# Patient Record
Sex: Female | Born: 1970 | Race: White | Hispanic: No | Marital: Married | Smoking: Never smoker
Health system: Southern US, Community
[De-identification: ages and names within clinical notes are randomized; demographics above are authoritative.]

## PROBLEM LIST (undated history)

## (undated) HISTORY — PX: PARTIAL HYSTERECTOMY: SHX80

---

## 1996-07-17 ENCOUNTER — Encounter: Payer: Self-pay | Admitting: Internal Medicine

## 1996-08-09 ENCOUNTER — Encounter: Payer: Self-pay | Admitting: Internal Medicine

## 1996-08-10 ENCOUNTER — Encounter: Payer: Self-pay | Admitting: Internal Medicine

## 2000-03-18 ENCOUNTER — Inpatient Hospital Stay (HOSPITAL_COMMUNITY): Admission: AD | Admit: 2000-03-18 | Discharge: 2000-03-18 | Payer: Self-pay | Admitting: Obstetrics and Gynecology

## 2000-03-24 ENCOUNTER — Inpatient Hospital Stay (HOSPITAL_COMMUNITY): Admission: AD | Admit: 2000-03-24 | Discharge: 2000-03-24 | Payer: Self-pay | Admitting: Obstetrics & Gynecology

## 2000-07-06 ENCOUNTER — Inpatient Hospital Stay (HOSPITAL_COMMUNITY): Admission: AD | Admit: 2000-07-06 | Discharge: 2000-07-06 | Payer: Self-pay | Admitting: Obstetrics and Gynecology

## 2000-07-16 ENCOUNTER — Inpatient Hospital Stay (HOSPITAL_COMMUNITY): Admission: AD | Admit: 2000-07-16 | Discharge: 2000-07-19 | Payer: Self-pay | Admitting: Obstetrics and Gynecology

## 2002-07-11 ENCOUNTER — Encounter: Admission: RE | Admit: 2002-07-11 | Discharge: 2002-07-11 | Payer: Self-pay | Admitting: Obstetrics and Gynecology

## 2002-07-11 ENCOUNTER — Encounter: Payer: Self-pay | Admitting: Obstetrics and Gynecology

## 2003-01-16 ENCOUNTER — Encounter: Payer: Self-pay | Admitting: Obstetrics and Gynecology

## 2003-01-16 ENCOUNTER — Encounter: Admission: RE | Admit: 2003-01-16 | Discharge: 2003-01-16 | Payer: Self-pay | Admitting: Obstetrics and Gynecology

## 2003-10-17 ENCOUNTER — Other Ambulatory Visit: Admission: RE | Admit: 2003-10-17 | Discharge: 2003-10-17 | Payer: Self-pay | Admitting: Obstetrics and Gynecology

## 2004-09-16 ENCOUNTER — Other Ambulatory Visit: Admission: RE | Admit: 2004-09-16 | Discharge: 2004-09-16 | Payer: Self-pay | Admitting: Obstetrics and Gynecology

## 2005-10-28 ENCOUNTER — Other Ambulatory Visit: Admission: RE | Admit: 2005-10-28 | Discharge: 2005-10-28 | Payer: Self-pay | Admitting: Obstetrics and Gynecology

## 2006-01-28 ENCOUNTER — Ambulatory Visit (HOSPITAL_COMMUNITY): Admission: RE | Admit: 2006-01-28 | Discharge: 2006-01-28 | Payer: Self-pay | Admitting: Obstetrics and Gynecology

## 2006-01-28 ENCOUNTER — Encounter (INDEPENDENT_AMBULATORY_CARE_PROVIDER_SITE_OTHER): Payer: Self-pay | Admitting: *Deleted

## 2008-04-17 ENCOUNTER — Ambulatory Visit: Payer: Self-pay | Admitting: Internal Medicine

## 2008-04-17 DIAGNOSIS — R1032 Left lower quadrant pain: Secondary | ICD-10-CM | POA: Insufficient documentation

## 2008-04-17 DIAGNOSIS — K5900 Constipation, unspecified: Secondary | ICD-10-CM | POA: Insufficient documentation

## 2008-04-18 ENCOUNTER — Encounter: Payer: Self-pay | Admitting: Internal Medicine

## 2008-04-19 ENCOUNTER — Encounter: Payer: Self-pay | Admitting: Internal Medicine

## 2008-05-27 ENCOUNTER — Ambulatory Visit: Payer: Self-pay | Admitting: Internal Medicine

## 2010-10-30 ENCOUNTER — Encounter
Admission: RE | Admit: 2010-10-30 | Discharge: 2010-10-30 | Payer: Self-pay | Source: Home / Self Care | Attending: Obstetrics and Gynecology | Admitting: Obstetrics and Gynecology

## 2010-12-06 ENCOUNTER — Encounter: Payer: Self-pay | Admitting: Internal Medicine

## 2010-12-06 ENCOUNTER — Encounter: Payer: Self-pay | Admitting: Obstetrics and Gynecology

## 2011-04-02 NOTE — Discharge Summary (Signed)
[facility] [place]  Patient:    Monica Carpenter, Monica Carpenter                      MRN: [id number] Adm. Date:  [id number] Disc. Date: [id number] Attending:  Cordelia Pen Ii Dictator:   Danie Chandler, R.N.                           Discharge Summary  ADMISSION DIAGNOSES:          1. Intrauterine pregnancy at term.                               2. Previous cesarean section x 3.  DISCHARGE DIAGNOSES:          1. Intrauterine pregnancy at term.                               2. Previous cesarean section x 3.  PROCEDURES:                   On July 16, 2000, repeat low transverse cesarean section.  HISTORY OF PRESENT ILLNESS:   The patient is 40 year old, married, white female, gravida 4, para 3, with an estimated date of confinement of July 25, 2000, who has had three previous cesarean sections.  HOSPITAL COURSE:              The patient was taken to the operating room and underwent the above-named procedure without complications.  This was productive of a viable female infant with Apgars of 8 at one minute and 9 at five minutes and an arterial cord pH of 7.34.  Postoperatively, the patient did well.  On postoperative day #1, the patients hemoglobin was 10.6, hematocrit 30.3, and white blood cell count 9.0.  The patient had a good return of bowel function on this day.  On postoperative day #2, the patient was ambulating well without difficulty and had good pain control.  She was also tolerating a regular diet.  DISPOSITION:                  She was discharged home on postoperative day #3.  CONDITION ON DISCHARGE:       Good.  DIET:                         Regular as tolerated.  ACTIVITY:                     No heavy lifting.  No driving.  No vaginal entry.  FOLLOW-UP:                    She is to follow up in the office on Friday for staple removal.  She is to call for temperature for greater than 100 degrees, persistent nausea or vomiting, heavy  vaginal bleeding, and/or redness or drainage from the incision site.  DISCHARGE MEDICATIONS:        1. Prenatal vitamins one p.o. q.d.                               2. Tylox, #30, one to two p.o. q.3-4h. p.r.n.  pain. DD:  07/19/00 TD:  07/19/00 Job: [id number]

## 2011-04-02 NOTE — Op Note (Signed)
[facility] [address]  Patient:    Monica Carpenter, Monica Carpenter                      MRN: [id number] Proc. Date: 07/16/00 Adm. Date:  [id number] Attending:  Cordelia Pen Ii                           Operative Report  PREOPERATIVE DIAGNOSES:       1. Intrauterine pregnancy at term.                               2. Previous cesarean section x 3.  POSTOPERATIVE DIAGNOSIS:  PROCEDURE:                    Repeat low transverse cesarean section.  SURGEON:                      Juluis Mire, M.D.  ANESTHESIA:                   Spinal.  ESTIMATED BLOOD LOSS:         600 cc.  FINDINGS:                     A viable female infant with Apgars of 8 and 9 at one and five minutes, respectively.  Birth weight pending.  Arterial cord pH 7.34.  INDICATIONS:                  This patient is a 40 year old, married, white female, G4, P3, EDC of July 25, 2000, who has had three previous cesarean sections.  A repeat cesarean section was discussed with the patient.  The possible risks and complications have been discussed, including, but not limited infection, organ damage, bleeding requiring transfusion of blood products with possible transfusion HIV or hepatitis acquisition, DVT, PE, and pneumonia.  The increased risk of complications with increasing number of C-sections has also been discussed.  All questions are answered and consent is found on the chart.  DESCRIPTION OF PROCEDURE:     The patient was taken to the operating room where a spinal anesthetic was placed.  She was then placed in the dorsosupine position with a 15-degree left lateral wedge.  She was prepped abdominally.  A Foley catheter was placed in the bladder.  She was draped in a sterile fashion.  After testing for adequate anesthesia, the previous scar was removed on the way in and a Pfannenstiel incision was dissected to the peritoneum, which was entered sharply and extended superiorly and inferiorly.   The vesicouterine peritoneum was taken down cephalolaterally and the bladder flap was developed.  The lower uterine segment was quite thin.  The uterus was incised in a low transverse manner and the uterine cavity was entered bluntly with a Kelly clamp.  The incision was extended cephalolaterally with the fingers.  Artificial rupture of membranes with clear fluid was then carried out.  The vertex was then delivered and the oropharynx and nasopharynx were suctioned.  The remainder of the infant was delivered with trouble.  Good cry and tone were noted.  The cord was clamped and cut and the infant was handed to the awaiting pediatrics team.  The placenta was manually delivered.  The internal uterine contour was normal.  The uterus was closed in a running  locking fashion with 0 Monocryl suture.  A single figure-of-eight sutures was placed at the right angle to obtain complete hemostasis.  The tubes and ovaries were normal bilaterally.  Irrigation was carried out.  The anterior peritoneum was closed in a running fashion with 0 Monocryl, which was also used to reapproximate the pyramidalis muscle in the midline.  The anterior rectus fascia was closed in a running fashion with a 0 PDS suture.  The subcutaneous tissues were undermined a small amount with a unipolar cautery on the upper and lower aspects of the incision to mobilize the skin.  The skin was then closed with clips.  All sponge, needle, and instrument counts were correct.  The patient was transferred to the recovery room in stable condition. DD:  07/16/00 TD:  07/18/00 Job: [id number]

## 2011-04-02 NOTE — Op Note (Signed)
Monica Carpenter, Monica Carpenter               ACCOUNT NO.:  [id number]   MEDICAL RECORD NO.:  [id number]          PATIENT TYPE:  AMB   LOCATION:  SDC                           FACILITY:  [facility]   PHYSICIAN:  Guy Sandifer. Henderson Cloud, M.D. DATE OF BIRTH:  1971/03/23   DATE OF PROCEDURE:  01/28/2006  DATE OF DISCHARGE:                                 OPERATIVE REPORT   PREOPERATIVE DIAGNOSIS:  Desires sterilization.   POSTOPERATIVE DIAGNOSIS:  1.  Desires permanent sterilization.  2.  Endometriosis.   PROCEDURE:  Laparoscopy with bilateral tubal ligation Filshie clips, left  ovarian biopsy, peritoneal washings and fulguration of endometriosis.   SURGEON:  Harold Hedge, M.D.   ANESTHESIA:  General endotracheal intubation.   ESTIMATED BLOOD LOSS:  Drops   SPECIMENS:  1.  Left ovarian biopsy.  2.  Peritoneal washings.   INDICATIONS:  The patient is a 40 year old married white female gravida 4,  para 4, who desires permanent sterilization. Alternatives contraception have  been reviewed. Potential risks and complications were discussed  preoperatively including but not limited to infection, bowel, bladder,  ureteral damage, bleeding requiring transfusion of blood products with  possible transfusion reaction, HIV and hepatitis acquisition, DVT, PE,  pneumonia. Permanence of the procedure and the failure rate and increased  ectopic risk have also been reviewed. All questions were answered, consent  signed on the chart.   FINDINGS:  Upper abdomen is grossly normal. Peritoneal surfaces are smooth  without adhesion. Anterior cul-de-sac contains scarring over the  vesicouterine peritoneum status post cesarean section. Posterior cul-de-sac  contains a dark, black implant of endometriosis about 4 mm across in the  center as well as a window in the peritoneum in the center of the posterior  cul-de-sac. The right tube and ovary are normal. The left tube was normal.  The left ovary contains a 3 cm growth of  otherwise normal appearing ovarian  tissue on a narrow stalk. The remainder of the left ovary appears normal.   PROCEDURE:  The patient is taken to operating room where she is identified,  placed in dorsosupine position and general anesthesia is induced via  endotracheal intubation. She is then placed in dorsal lithotomy position  where she is prepped abdominally and vaginally. Bladder straight  catheterized. Hulka tenaculum was placed in the uterus as manipulator and  she is draped in sterile fashion. The infraumbilical area as well as the  suprapubic region are injected with 1/2% plain Marcaine. A small  infraumbilical incision was made and a disposable Veress needle was placed  with a normal syringe and drop test. 2 liters of gas were insufflated under  low pressure with good tympany in the right upper quadrant. Then using the  XL 10/11 bladeless disposable trocar sleeve and using direct visualization  with the diagnostic laparoscopic the trocar is placed through the  infraumbilical incision without difficulty. Switching to the operative  laparoscope reinspection again reveals no damage to surrounding structures.  Small suprapubic incision was made and a 5 mm disposable bladeless XL trocar  sleeve was placed under direct visualization without difficulty. This was  done  well above the bladder. Careful inspection reveals the above. 60 mL of  normal saline are instilled into the pelvis and then aspirated and sent for  cytology. The stalk of the growth on the left ovary is cauterized with  bipolar cautery and resected with scissors without difficulty. Excellent  hemostasis was maintained. This was placed in the posterior cul-de-sac for  retrieval. The right fallopian tube was identified from cornu to fimbria and  a Filshie clip was placed on the proximal 1/3 of the tube. Similar procedure  is carried out on the left. After removal of the Filshie clip applicator  reinspection reveals the  total width of the tube is within the grasp of the  clips and heel of the clips could be seen through the mesosalpinx  bilaterally. Then using the 5-mm laparoscope through the suprapubic trocar  sleeve. The EndoCatch was used through the umbilical trocar sleeve to  retrieve the ovarian biopsy without difficulty. Switching back to the  operative laparoscope careful reinspection under reduced pneumoperitoneum  reveals excellent hemostasis all around. The implant in posterior cul-de-sac  is ablated with bipolar cautery. Suprapubic trocar sleeve was removed.  Pneumoperitoneum is reduced and the umbilical trocar sleeve was removed. The  fascia in the umbilical incision was closed with 0 Vicryl suture with care  being taken not to pick up any underlying structures. 3-0 Vicryl was used  with simple sutures to close the skin suprapubically and umbilically.  Dermabond was placed for dressing of both. Hulka tenaculum was removed and  no bleeding is noted. All counts correct. The patient is awakened, taken to  recovery room in stable condition.      Guy Sandifer Henderson Cloud, M.D.  Electronically Signed     JET/MEDQ  D:  01/28/2006  T:  01/29/2006  Job:  [id number]

## 2011-04-02 NOTE — H&P (Signed)
Monica Carpenter, Monica Carpenter               ACCOUNT NO.:  [id number]   MEDICAL RECORD NO.:  [id number]          PATIENT TYPE:  AMB   LOCATION:  SDC                           FACILITY:  [facility]   PHYSICIAN:  Guy Sandifer. Henderson Cloud, M.D. DATE OF BIRTH:  03-08-1971   DATE OF ADMISSION:  01/28/2006  DATE OF DISCHARGE:                                HISTORY & PHYSICAL   CHIEF COMPLAINT:  Desires permanent sterilization.   HISTORY OF PRESENT ILLNESS:  This patient is a 40 year old married white  female G4, P4 who desires permanent sterilization.  Alternate methods of  contraception have been discussed.  Tubal ligation has been discussed  preoperatively.  Potential risks and complications have been reviewed  preoperatively including, but not limited to, infection, bowel, bladder,  ureteral damage, bleeding requiring transfusion of blood products with  possible transfusion reaction, HIV and hepatitis acquisition, DVT, PE,  pneumonia.  Permanency of the procedure, failure rate, and increased ectopic  risks have also been discussed.   PAST MEDICAL HISTORY:  Negative.   PAST SURGICAL HISTORY:  Negative.   OBSTETRICAL HISTORY:  Cesarean section x4.   MEDICATIONS:  None.   ALLERGIES:  PENICILLIN leading to a yeast infection.   FAMILY HISTORY:  Heart attack in maternal grandfather, diabetes in father,  migraine headaches in sisters, colon cancer in paternal grandmother and  paternal aunts.   SOCIAL HISTORY:  Denies tobacco, alcohol, or drug abuse.   REVIEW OF SYSTEMS:  NEUROLOGIC:  Denies headache.  CARDIAC:  Denies chest  pain.  PULMONARY:  Denies shortness of breath.  GASTROINTESTINAL:  Denies  recent changes in bowel habits.   PHYSICAL EXAMINATION:  VITAL SIGNS:  Height 5 feet 2 inches.  Weight 105  pounds.  Blood pressure 104/70.  HEENT:  Without thyromegaly.  LUNGS:  Clear to auscultation.  HEART:  Regular rate and rhythm.  BACK:  Without CVA tenderness.  BREASTS:  Without mass, retraction,  discharge.  ABDOMEN:  Soft, nontender, without masses.  PELVIC:  Vulva, vagina, cervix without lesion.  Uterus normal size, mobile,  nontender.  Adnexa nontender without masses.  EXTREMITIES:  Grossly within normal limits.  NEUROLOGIC:  Grossly within normal limits.   ASSESSMENT:  Multiparity, desires permanent sterilization.   PLAN:  Laparoscopy with bilateral tubal ligation with Filshie clips.      Guy Sandifer Henderson Cloud, M.D.  Electronically Signed     JET/MEDQ  D:  01/19/2006  T:  01/19/2006  Job:  [id number]

## 2015-08-28 ENCOUNTER — Emergency Department (HOSPITAL_COMMUNITY)
Admission: EM | Admit: 2015-08-28 | Discharge: 2015-08-28 | Disposition: A | Discharge status: Home / Self Care | Payer: Managed Care, Other (non HMO) | Attending: Emergency Medicine | Admitting: Emergency Medicine

## 2015-08-28 ENCOUNTER — Emergency Department (HOSPITAL_COMMUNITY): Payer: Managed Care, Other (non HMO)

## 2015-08-28 ENCOUNTER — Encounter (HOSPITAL_COMMUNITY): Payer: Self-pay | Admitting: Emergency Medicine

## 2015-08-28 DIAGNOSIS — R1031 Right lower quadrant pain: Secondary | ICD-10-CM | POA: Diagnosis present

## 2015-08-28 DIAGNOSIS — R935 Abnormal findings on diagnostic imaging of other abdominal regions, including retroperitoneum: Secondary | ICD-10-CM | POA: Diagnosis not present

## 2015-08-28 DIAGNOSIS — Z9889 Other specified postprocedural states: Secondary | ICD-10-CM | POA: Insufficient documentation

## 2015-08-28 DIAGNOSIS — Z3202 Encounter for pregnancy test, result negative: Secondary | ICD-10-CM | POA: Diagnosis not present

## 2015-08-28 DIAGNOSIS — N760 Acute vaginitis: Secondary | ICD-10-CM | POA: Insufficient documentation

## 2015-08-28 DIAGNOSIS — K529 Noninfective gastroenteritis and colitis, unspecified: Secondary | ICD-10-CM

## 2015-08-28 DIAGNOSIS — B9689 Other specified bacterial agents as the cause of diseases classified elsewhere: Secondary | ICD-10-CM

## 2015-08-28 LAB — COMPREHENSIVE METABOLIC PANEL
ALK PHOS: 59 U/L (ref 38–126)
ALT: 44 U/L (ref 14–54)
AST: 42 U/L — AB (ref 15–41)
Albumin: 3.8 g/dL (ref 3.5–5.0)
Anion gap: 7 (ref 5–15)
BUN: 8 mg/dL (ref 6–20)
CALCIUM: 9.1 mg/dL (ref 8.9–10.3)
CO2: 27 mmol/L (ref 22–32)
CREATININE: 0.85 mg/dL (ref 0.44–1.00)
Chloride: 101 mmol/L (ref 101–111)
GFR calc non Af Amer: >60 mL/min (ref >60)
Glucose, Bld: 103 mg/dL — ABNORMAL HIGH (ref 65–99)
Potassium: 4.3 mmol/L (ref 3.5–5.1)
SODIUM: 135 mmol/L (ref 135–145)
Total Bilirubin: 0.8 mg/dL (ref 0.3–1.2)
Total Protein: 6.6 g/dL (ref 6.5–8.1)

## 2015-08-28 LAB — URINALYSIS, ROUTINE W REFLEX MICROSCOPIC
BILIRUBIN URINE: NEGATIVE
GLUCOSE, UA: NEGATIVE mg/dL
KETONES UR: 15 mg/dL — AB
Nitrite: POSITIVE — AB
PH: 6.5 (ref 5.0–8.0)
PROTEIN: NEGATIVE mg/dL
Specific Gravity, Urine: 1.006 (ref 1.005–1.030)
Urobilinogen, UA: 0.2 mg/dL (ref 0.0–1.0)

## 2015-08-28 LAB — URINE MICROSCOPIC-ADD ON

## 2015-08-28 LAB — WET PREP, GENITAL
TRICH WET PREP: NONE SEEN
WBC WET PREP: NONE SEEN
YEAST WET PREP: NONE SEEN

## 2015-08-28 LAB — CBC WITH DIFFERENTIAL/PLATELET
BASOS PCT: 0 %
Basophils Absolute: 0 10*3/uL (ref 0.0–0.1)
EOS ABS: 0.1 10*3/uL (ref 0.0–0.7)
EOS PCT: 1 %
HCT: 37.9 % (ref 36.0–46.0)
HEMOGLOBIN: 12.4 g/dL (ref 12.0–15.0)
LYMPHS ABS: 1 10*3/uL (ref 0.7–4.0)
Lymphocytes Relative: 8 %
MCH: 29.3 pg (ref 26.0–34.0)
MCHC: 32.7 g/dL (ref 30.0–36.0)
MCV: 89.6 fL (ref 78.0–100.0)
MONO ABS: 1 10*3/uL (ref 0.1–1.0)
MONOS PCT: 7 %
NEUTROS PCT: 84 %
Neutro Abs: 10.9 10*3/uL — ABNORMAL HIGH (ref 1.7–7.7)
PLATELETS: 223 10*3/uL (ref 150–400)
RBC: 4.23 MIL/uL (ref 3.87–5.11)
RDW: 13 % (ref 11.5–15.5)
WBC: 13.1 10*3/uL — ABNORMAL HIGH (ref 4.0–10.5)

## 2015-08-28 LAB — POC URINE PREG, ED: Preg Test, Ur: NEGATIVE

## 2015-08-28 LAB — HCG, QUANTITATIVE, PREGNANCY: hCG, Beta Chain, Quant, S: 2 m[IU]/mL (ref <5)

## 2015-08-28 LAB — LIPASE, BLOOD: LIPASE: 32 U/L (ref 22–51)

## 2015-08-28 MED ORDER — CIPROFLOXACIN HCL 500 MG PO TABS: 500.0000 mg | ORAL_TABLET | Freq: Two times a day (BID) | ORAL | Status: DC

## 2015-08-28 MED ORDER — SODIUM CHLORIDE 0.9 % IV BOLUS (SEPSIS)
1000.0000 mL | Freq: Once | INTRAVENOUS | Status: AC
  Administered 2015-08-28: 1000 mL via INTRAVENOUS

## 2015-08-28 MED ORDER — MORPHINE SULFATE (PF) 4 MG/ML IV SOLN
4.0000 mg | Freq: Once | INTRAVENOUS | Status: AC
Start: 2015-08-28 — End: 2015-08-28
  Administered 2015-08-28: 4 mg via INTRAVENOUS
  Filled 2015-08-28: qty 1

## 2015-08-28 MED ORDER — IOHEXOL 300 MG/ML  SOLN
25.0000 mL | INTRAMUSCULAR | Status: AC
  Administered 2015-08-28 (×2): 25 mL via ORAL

## 2015-08-28 MED ORDER — IOHEXOL 300 MG/ML  SOLN
100.0000 mL | Freq: Once | INTRAMUSCULAR | Status: AC | PRN
  Administered 2015-08-28: 100 mL via INTRAVENOUS

## 2015-08-28 MED ORDER — ONDANSETRON HCL 4 MG/2ML IJ SOLN
4.0000 mg | Freq: Once | INTRAMUSCULAR | Status: AC
  Administered 2015-08-28: 4 mg via INTRAVENOUS
  Filled 2015-08-28: qty 2

## 2015-08-28 MED ORDER — HYDROCODONE-ACETAMINOPHEN 5-325 MG PO TABS: 1.0000 | ORAL_TABLET | Freq: Four times a day (QID) | ORAL | Status: DC | PRN

## 2015-08-28 MED ORDER — METRONIDAZOLE 500 MG PO TABS: 500.0000 mg | ORAL_TABLET | Freq: Two times a day (BID) | ORAL | Status: DC

## 2015-08-28 MED ORDER — ONDANSETRON HCL 4 MG PO TABS: 4.0000 mg | ORAL_TABLET | Freq: Three times a day (TID) | ORAL | Status: DC | PRN

## 2015-08-28 MED ORDER — MORPHINE SULFATE (PF) 4 MG/ML IV SOLN
4.0000 mg | Freq: Once | INTRAVENOUS | Status: AC
  Administered 2015-08-28: 4 mg via INTRAVENOUS
  Filled 2015-08-28: qty 1

## 2015-08-28 NOTE — Discharge Instructions (Signed)
You have been evaluated for your abdominal pain. It appears that you have an abnormal finding on your uterus that will require further evaluation by your OBGYN.  Call the office tomorrow to be seen by Dr. Noralee SpaceLowd or Dr. Perlie GoldGreywall. You will likely need an endometrial biopsy.  Take antibiotics as prescribed for treatment for potential infection (colitis and bacterial vaginosis).  Take pain medication as needed.  Return if your condition worsen or if you have other concerns.    Colitis Colitis is inflammation of the colon. Colitis may last a short time (acute) or it may last a long time (chronic). CAUSES This condition may be caused by:  Viruses.  Bacteria.  Reactions to medicine.  Certain autoimmune diseases, such as Crohn disease or ulcerative colitis. SYMPTOMS Symptoms of this condition include:  Diarrhea.  Passing bloody or tarry stool.  Pain.  Fever.  Vomiting.  Tiredness (fatigue).  Weight loss.  Bloating.  Sudden increase in abdominal pain.  Having fewer bowel movements than usual. DIAGNOSIS This condition is diagnosed with a stool test or a blood test. You may also have other tests, including X-rays, a CT scan, or a colonoscopy. TREATMENT Treatment may include:  Resting the bowel. This involves not eating or drinking for a period of time.  Fluids that are given through an IV tube.  Medicine for pain and diarrhea.  Antibiotic medicines.  Cortisone medicines.  Surgery. HOME CARE INSTRUCTIONS Eating and Drinking  Follow instructions from your health care provider about eating or drinking restrictions.  Drink enough fluid to keep your urine clear or pale yellow.  Work with a dietitian to determine which foods cause your condition to flare up.  Avoid foods that cause flare-ups.  Eat a well-balanced diet. Medicines  Take over-the-counter and prescription medicines only as told by your health care provider.  If you were prescribed an antibiotic medicine,  take it as told by your health care provider. Do not stop taking the antibiotic even if you start to feel better. General Instructions  Keep all follow-up visits as told by your health care provider. This is important. SEEK MEDICAL CARE IF:  Your symptoms do not go away.  You develop new symptoms. SEEK IMMEDIATE MEDICAL CARE IF:  You have a fever that does not go away with treatment.  You develop chills.  You have extreme weakness, fainting, or dehydration.  You have repeated vomiting.  You develop severe pain in your abdomen.  You pass bloody or tarry stool.   This information is not intended to replace advice given to you by your health care provider. Make sure you discuss any questions you have with your health care provider.   Document Released: 12/09/2004 Document Revised: 07/23/2015 Document Reviewed: 02/24/2015 Elsevier Interactive Patient Education [employer]2016 [employer]

## 2015-08-28 NOTE — ED Notes (Signed)
Pt sat on bedpan and was unable to give a sample will check back later.

## 2015-08-28 NOTE — ED Notes (Signed)
Suture cart at the bedside.  

## 2015-08-28 NOTE — ED Notes (Signed)
Pt verbalized understanding of d/c instructions and has no further questions. Pt stable and NAD.  

## 2015-08-28 NOTE — ED Provider Notes (Signed)
CSN: [id number]     Arrival date & time 08/28/15  1427 History   First MD Initiated Contact with Patient 08/28/15 1653     Chief Complaint  Patient presents with  . Abdominal Pain     (Consider location/radiation/quality/duration/timing/severity/associated sxs/prior Treatment) HPI   44 year old female sent here from primary care provider's office for further evaluation of right lower quadrant abdominal pain and to rule out for appendicitis. Patient reports gradual onset of low abdominal pain since yesterday after she ate. She described pain as a nagging dull sensation radiating across her abdomen which is Progressively worse especially with palpation or movement. Pain is currently of moderate intensity. She did take ibuprofen this morning with minimal improvement. She denies fever but does endorse chills. Last menstrual period was last week. She denies strenuous activities or any recent injury. No complaints of severe headache, neck stiffness, chest pain, shortness of breath, productive cough, back pain, dysuria, hematuria, vaginal bleeding, vaginal discharge, or rash. She has intact appendix. She was initially seen at her PCP office for this complaint. It was noted that she has a mild leukocytosis.  History reviewed. No pertinent past medical history. Past Surgical History  Procedure Laterality Date  . Cesarean section     No family history on file. Social History  Substance Use Topics  . Smoking status: Never Smoker   . Smokeless tobacco: None  . Alcohol Use: No   OB History    No data available     Review of Systems  All other systems reviewed and are negative.     Allergies  Review of patient's allergies indicates no known allergies.  Home Medications   Prior to Admission medications   Not on File   BP 118/64 mmHg  Pulse 92  Temp(Src) 98.1 F (36.7 C) (Oral)  Resp 18  Ht  (1.575 m)  Wt 110 lb 2 oz (49.952 kg)  BMI 20.14 kg/m2  LMP 08/21/2015 Physical  Exam  Constitutional: She appears well-developed and well-nourished. No distress.  HENT:  Head: Atraumatic.  Eyes: Conjunctivae are normal.  Neck: Neck supple.  Cardiovascular: Normal rate and regular rhythm.   Pulmonary/Chest: Effort normal and breath sounds normal.  Abdominal: Soft. Bowel sounds are normal. She exhibits no distension. There is tenderness (Diffuse abdominal tenderness most significant to right lower quadrant without guarding or rebound tenderness. Negative psoas or obturator sign. Negative Murphy sign.).  Genitourinary:  Pelvic exam: RN in room as chaperone, external female genitalia normal with no signs of lesions or injuries. Speculum exam shows normal cervix with copious yellow  discharge. Bimanual exam with no adnexal tenderness, no cervical motion tenderness, uterus normal size and nontender, no masses appreciated. The external cervical os is closed.   Neurological: She is alert.  Skin: No rash noted.  Psychiatric: She has a normal mood and affect.  Nursing note and vitals reviewed.   ED Course  Procedures (including critical care time)  Patient presents with low abdominal pain concerning for suspect appendicitis. Workup initiated, pain medication given, plan to obtain abdominal and pelvis CT scan for further evaluation. If CT scan showing no acute abnormality, then patient will need a pelvic examination for further evaluation.  9:02 PM Pregnancy test is negative. Urine shows nitrite positive with many bacteria but trace leukocyte esterase. Leukocytosis with WBC 13.1. Her abdominal and pelvic CT scan showed no evidence of appendicitis however there is a 6 cm rounded enhancing uterine endometrial mass most suggestive of endometrial carcinoma. There are also a  thickened edematous finding at the hepatic flexure of colon with a 9 mm fecalith. This is likely an inflammatory colitis but radiologist cannot exclude the possibility of metastasis or malignancy at the hepatic  flexure. I discussed this finding with Dr. Rennis Chris. Patient will need a pelvic examination and a consultation to OB/GYN for further management.  9:49 PM On pelvic examination, moderate yellow discharge noted. No cervical motion tenderness. No abnormal bleeding. No obvious mass appreciated on bimanual examination. I did discuss finding with the patient. She does have an OB/GYN, Dr. Huntley Dec in which I will Attempt to reach out and consult to discuss the abnormal abdominal and pelvis CT scan concerning for endometrial carcinoma. Patient is made aware of finding and agrees with plan.  11:00 PM Appreciate consultation from OB/GYN Dr. Milton Ferguson. I discussed the finding of the CT scan with her and she felt the patient will need to follow-up tomorrow in office for an endometrial biopsy. I discussed this finding with patient and she agrees. She does have evidence of possible colitis and bacterial vaginosis therefore will treat with Cipro and Flagyl for 2 weeks. Pain medication and antinausea medication prescribed. Otherwise patient is stable for discharge.  Labs Review Labs Reviewed  WET PREP, GENITAL - Abnormal; Notable for the following:    Clue Cells Wet Prep HPF POC MODERATE (*)    All other components within normal limits  COMPREHENSIVE METABOLIC PANEL - Abnormal; Notable for the following:    Glucose, Bld 103 (*)    AST 42 (*)    All other components within normal limits  CBC WITH DIFFERENTIAL/PLATELET - Abnormal; Notable for the following:    WBC 13.1 (*)    Neutro Abs 10.9 (*)    All other components within normal limits  URINALYSIS, ROUTINE W REFLEX MICROSCOPIC (NOT AT [facility]) - Abnormal; Notable for the following:    APPearance CLOUDY (*)    Hgb urine dipstick SMALL (*)    Ketones, ur 15 (*)    Nitrite POSITIVE (*)    Leukocytes, UA TRACE (*)    All other components within normal limits  URINE MICROSCOPIC-ADD ON - Abnormal; Notable for the following:    Bacteria, UA MANY (*)    All  other components within normal limits  LIPASE, BLOOD  HCG, QUANTITATIVE, PREGNANCY  RPR  HIV ANTIBODY (ROUTINE TESTING)  POC URINE PREG, ED  GC/CHLAMYDIA PROBE AMP ([place]) NOT AT [facility]    Imaging Review Ct Abdomen Pelvis W Contrast  08/28/2015  CLINICAL DATA:  44 year old female with right side abdominal pain for several days. Symptoms increased when supine. Initial encounter. EXAM: CT ABDOMEN AND PELVIS WITH CONTRAST TECHNIQUE: Multidetector CT imaging of the abdomen and pelvis was performed using the standard protocol following bolus administration of intravenous contrast. CONTRAST:  OMNIPAQUE IOHEXOL 300 MG/ML  SOLN COMPARISON:  CT Abdomen and Pelvis 04/19/2008. FINDINGS: Minor dependent atelectasis or scarring in the costophrenic angles, otherwise negative lung bases. No pericardial or pleural effusion. No acute osseous abnormality identified. Heterogeneously enhancing rounded uterine mass with epicenter at the endometrium at the fundus encompasses 5-6 cm diameter and is new since 2009 (series 2, image 64 and sagittal image 79). The lower uterine segment and cervix are hyper enhancing. There is trace fluid in the vaginal fornix. No lower uterine soft tissue mass. The right adnexa (series 2, image 66) is prominent and hypodense but appears stable since 2009. The left adnexa appears normal, physiologic cyst. There is a surgical clip in the right cul-de-sac  which is new since 2009 (image 75). No pelvic free fluid. No pelvic sidewall lymphadenopathy. The rectum in urinary bladder appear to remain normal. The abnormal uterine fundus is inseparable from the bladder fundus. Retained stool in the sigmoid colon. Redundant transverse colon with some retained stool. At the hepatic flexure adjacent to the gallbladder in Morison's pouch there is abnormal soft tissue thickening. This seems to be circumferential colonic wall thickening. See series 2, image 38 and series 5, image 45. There is a round  fecalith measuring 9 mm at this level. No gallbladder wall thickening, but there is a small volume of free fluid in Morrison's pouch and adjacent to the tip of the liver. No pneumoperitoneum or extraluminal gas. No gas within the gallbladder or biliary tree. Oral contrast has reached the ascending colon, but not yet the abnormal segment at the hepatic flexure. Normal appendix best seen on coronal image 38. Dense contrast in small bowel. Jejunum measures at the upper limits of normal, 3 cm diameter, but other small bowel loops are within normal limits. Negative stomach and duodenum. No liver lesion. Spleen, pancreas, and adrenal glands are within normal limits. Renal contrast enhancement and contrast excretion is within normal limits. No retroperitoneal or abdominal lymphadenopathy. IMPRESSION: 1. Right abdominal pain felt related to abnormal hepatic flexure of colon which appears edematous and thickened surrounding at 9 mm fecalith. There is a small volume of free fluid in the region. No free air. 2. Superimposed 6 cm rounded and enhancing uterine endometrial mass, most suggestive of endometrial carcinoma. No pelvic ascites or lymphadenopathy. Ovaries appear stable since 2009. 3. The finding in #1 is presumed to be inflammatory colitis and unrelated to #2, however, I cannot exclude the possibility of metastasis/malignancy at the hepatic flexure. Electronically Signed   By: Odessa FlemingH  Hall M.D.   On: 08/28/2015 20:47   I have personally reviewed and evaluated these images and lab results as part of my medical decision-making.   EKG Interpretation None      MDM   Final diagnoses:  Colitis  BV (bacterial vaginosis)  Abnormal abdominal CT scan    BP 115/54 mmHg  Pulse 92  Temp(Src) 98.1 F (36.7 C) (Oral)  Resp 16  Ht 5\' 2"  (1.575 m)  Wt 110 lb 2 oz (49.952 kg)  BMI 20.14 kg/m2  SpO2 100%  LMP 08/21/2015     Fayrene HelperBowie Caryle Helgeson, PA-C 08/28/15 2302  Doug SouSam Jacubowitz, MD 08/29/15 [phone]

## 2015-08-28 NOTE — ED Notes (Signed)
Pt sent here by pcp to r/o appendicitis. Pt c/o RLQ pain worsening throughout the night. Denies nvd, fevers/chills. Denies urinary sx.

## 2015-08-29 LAB — HIV ANTIBODY (ROUTINE TESTING W REFLEX): HIV Screen 4th Generation wRfx: NONREACTIVE

## 2015-08-29 LAB — RPR: RPR: NONREACTIVE

## 2015-08-29 LAB — GC/CHLAMYDIA PROBE AMP (~~LOC~~) NOT AT ARMC
CHLAMYDIA, DNA PROBE: NEGATIVE
NEISSERIA GONORRHEA: NEGATIVE

## 2015-10-07 ENCOUNTER — Other Ambulatory Visit: Payer: Self-pay | Admitting: Obstetrics and Gynecology

## 2018-07-19 ENCOUNTER — Other Ambulatory Visit: Payer: Self-pay | Admitting: Obstetrics and Gynecology

## 2018-07-19 DIAGNOSIS — N6310 Unspecified lump in the right breast, unspecified quadrant: Secondary | ICD-10-CM

## 2018-07-19 DIAGNOSIS — N6320 Unspecified lump in the left breast, unspecified quadrant: Secondary | ICD-10-CM

## 2018-07-24 ENCOUNTER — Other Ambulatory Visit: Payer: Managed Care, Other (non HMO)

## 2018-07-24 ENCOUNTER — Ambulatory Visit
Admission: RE | Admit: 2018-07-24 | Discharge: 2018-07-24 | Disposition: A | Discharge status: Home / Self Care | Payer: Managed Care, Other (non HMO) | Source: Ambulatory Visit | Attending: Obstetrics and Gynecology | Admitting: Obstetrics and Gynecology

## 2018-07-24 DIAGNOSIS — N6320 Unspecified lump in the left breast, unspecified quadrant: Secondary | ICD-10-CM

## 2018-07-24 DIAGNOSIS — N6310 Unspecified lump in the right breast, unspecified quadrant: Secondary | ICD-10-CM

## 2018-07-31 ENCOUNTER — Other Ambulatory Visit: Payer: Self-pay | Admitting: Obstetrics and Gynecology

## 2018-07-31 DIAGNOSIS — N6002 Solitary cyst of left breast: Secondary | ICD-10-CM

## 2018-07-31 DIAGNOSIS — N63 Unspecified lump in unspecified breast: Secondary | ICD-10-CM

## 2018-08-09 ENCOUNTER — Other Ambulatory Visit: Payer: Managed Care, Other (non HMO)

## 2018-08-10 ENCOUNTER — Ambulatory Visit
Admission: RE | Admit: 2018-08-10 | Discharge: 2018-08-10 | Disposition: A | Discharge status: Home / Self Care | Payer: Managed Care, Other (non HMO) | Source: Ambulatory Visit | Attending: Obstetrics and Gynecology | Admitting: Obstetrics and Gynecology

## 2018-08-10 ENCOUNTER — Other Ambulatory Visit: Payer: Managed Care, Other (non HMO)

## 2018-08-10 ENCOUNTER — Other Ambulatory Visit: Payer: Self-pay | Admitting: Obstetrics and Gynecology

## 2018-08-10 DIAGNOSIS — N63 Unspecified lump in unspecified breast: Secondary | ICD-10-CM

## 2018-08-10 DIAGNOSIS — N6002 Solitary cyst of left breast: Secondary | ICD-10-CM

## 2019-02-12 ENCOUNTER — Other Ambulatory Visit: Payer: Self-pay | Admitting: Obstetrics and Gynecology

## 2019-02-12 DIAGNOSIS — N6310 Unspecified lump in the right breast, unspecified quadrant: Secondary | ICD-10-CM

## 2019-07-27 ENCOUNTER — Other Ambulatory Visit: Payer: Self-pay

## 2019-07-27 ENCOUNTER — Ambulatory Visit
Admission: RE | Admit: 2019-07-27 | Discharge: 2019-07-27 | Disposition: A | Discharge status: Home / Self Care | Payer: Managed Care, Other (non HMO) | Source: Ambulatory Visit | Attending: Obstetrics and Gynecology | Admitting: Obstetrics and Gynecology

## 2019-07-27 DIAGNOSIS — N6310 Unspecified lump in the right breast, unspecified quadrant: Secondary | ICD-10-CM

## 2020-02-12 ENCOUNTER — Other Ambulatory Visit: Payer: Self-pay | Admitting: Obstetrics and Gynecology

## 2020-02-12 DIAGNOSIS — N6310 Unspecified lump in the right breast, unspecified quadrant: Secondary | ICD-10-CM

## 2020-07-28 ENCOUNTER — Other Ambulatory Visit: Payer: Managed Care, Other (non HMO)

## 2020-12-17 ENCOUNTER — Other Ambulatory Visit: Payer: Self-pay | Admitting: Obstetrics and Gynecology

## 2020-12-17 DIAGNOSIS — N6310 Unspecified lump in the right breast, unspecified quadrant: Secondary | ICD-10-CM

## 2020-12-17 DIAGNOSIS — N644 Mastodynia: Secondary | ICD-10-CM

## 2021-01-07 ENCOUNTER — Other Ambulatory Visit: Payer: Self-pay

## 2021-01-07 ENCOUNTER — Ambulatory Visit
Admission: RE | Admit: 2021-01-07 | Discharge: 2021-01-07 | Disposition: A | Discharge status: Home / Self Care | Payer: Managed Care, Other (non HMO) | Source: Ambulatory Visit | Attending: Obstetrics and Gynecology | Admitting: Obstetrics and Gynecology

## 2021-01-07 DIAGNOSIS — N6310 Unspecified lump in the right breast, unspecified quadrant: Secondary | ICD-10-CM

## 2021-01-07 DIAGNOSIS — N644 Mastodynia: Secondary | ICD-10-CM

## 2021-01-27 ENCOUNTER — Other Ambulatory Visit: Payer: Managed Care, Other (non HMO)

## 2021-07-30 IMAGING — MG DIGITAL DIAGNOSTIC BILAT W/ TOMO W/ CAD
6 of 12 series · 6 of 36 positions shown · non-contrast
Comparison: Previous exam(s).

CLINICAL DATA: Patient had a complex fibroadenoma biopsied from the
right breast on 08/10/2018. 2+ year follow-up. Patient complains of
focal pain in the left breast.



[L MLO synth-2D (1 of 3)]
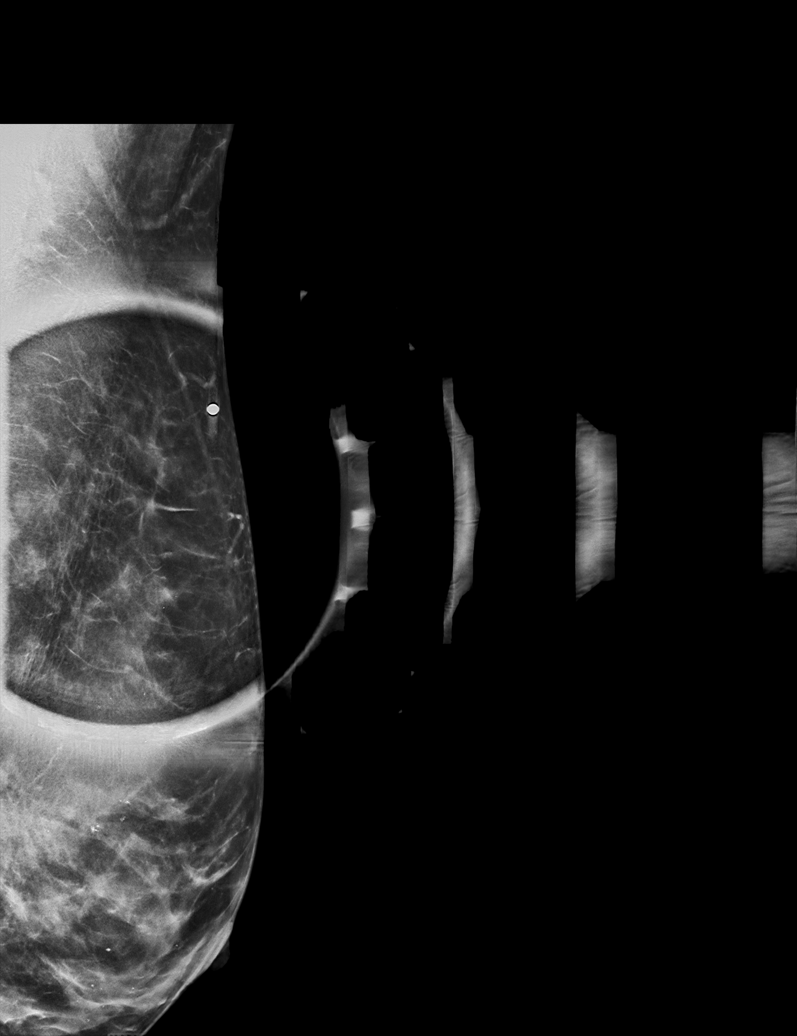

[R MLO synth-2D]
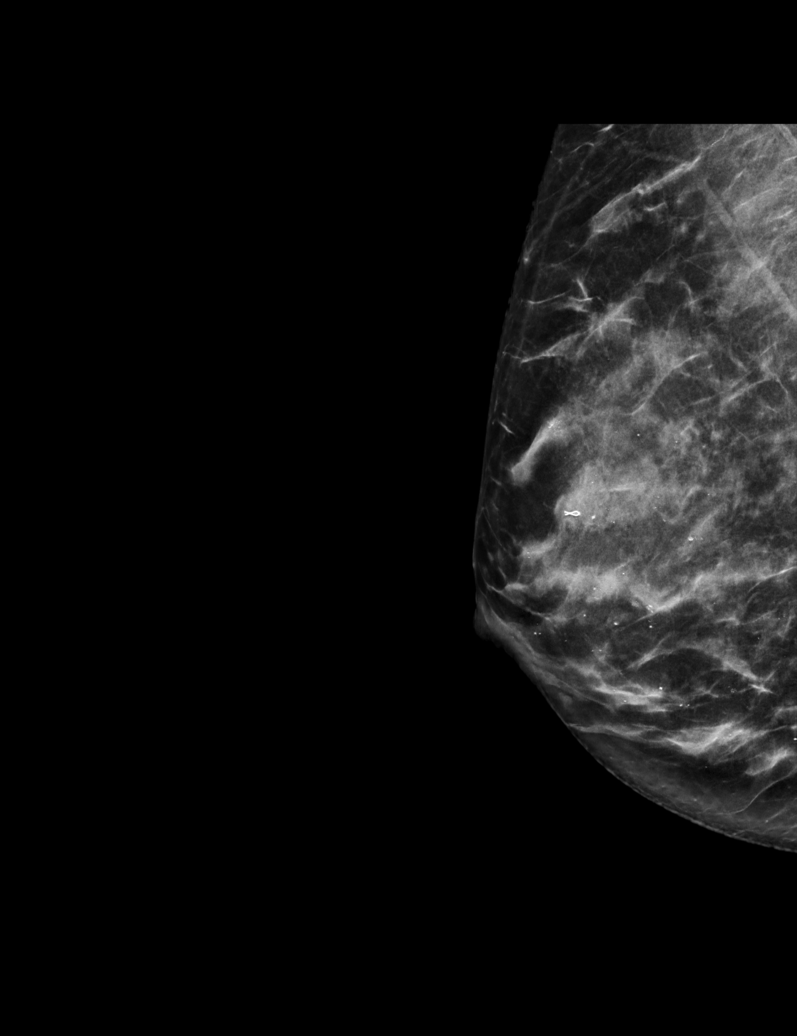

[L CC synth-2D]
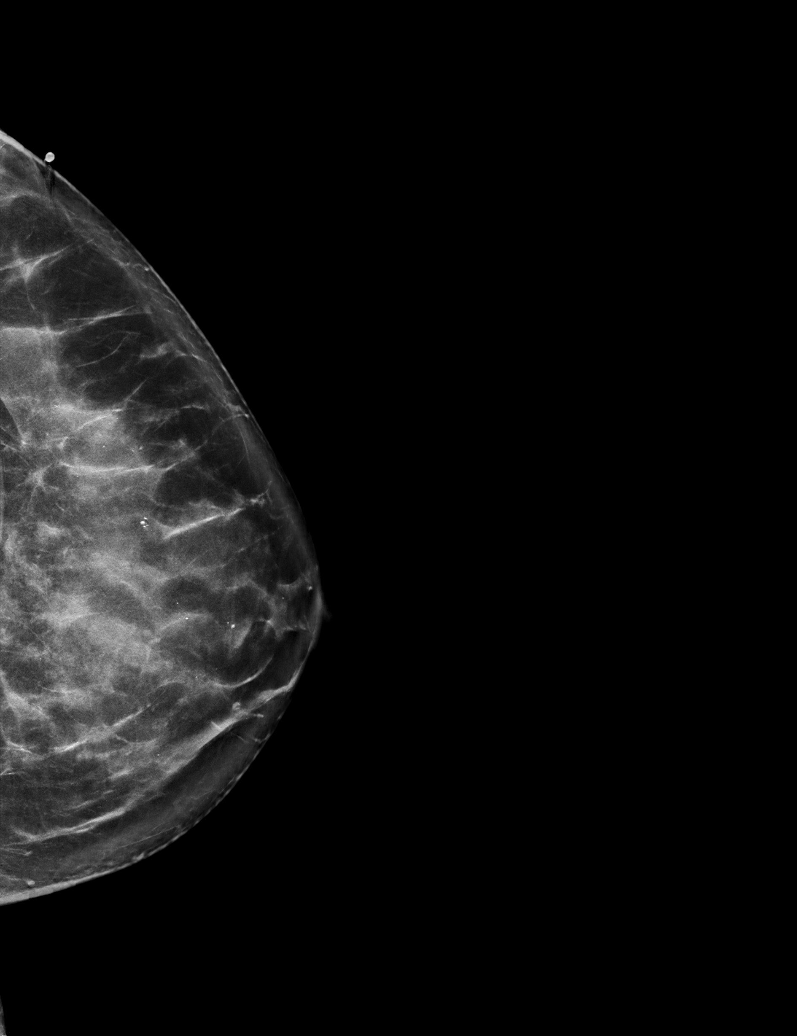

[L MLO synth-2D (2 of 3)]
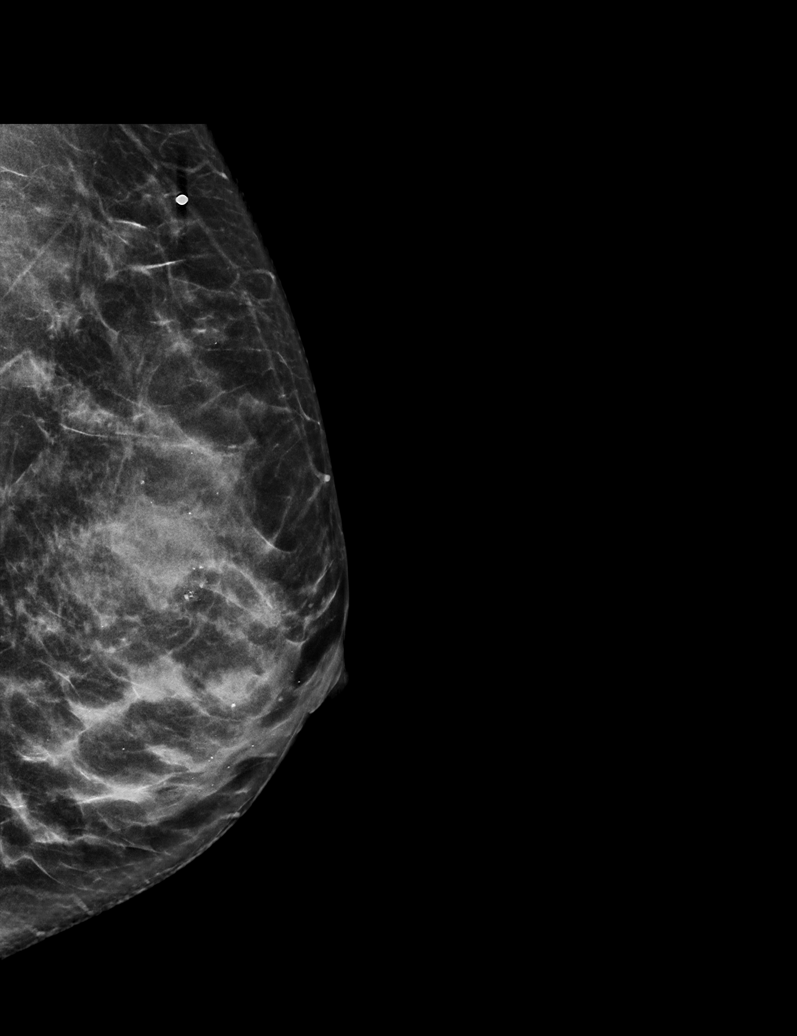

[R CC synth-2D]
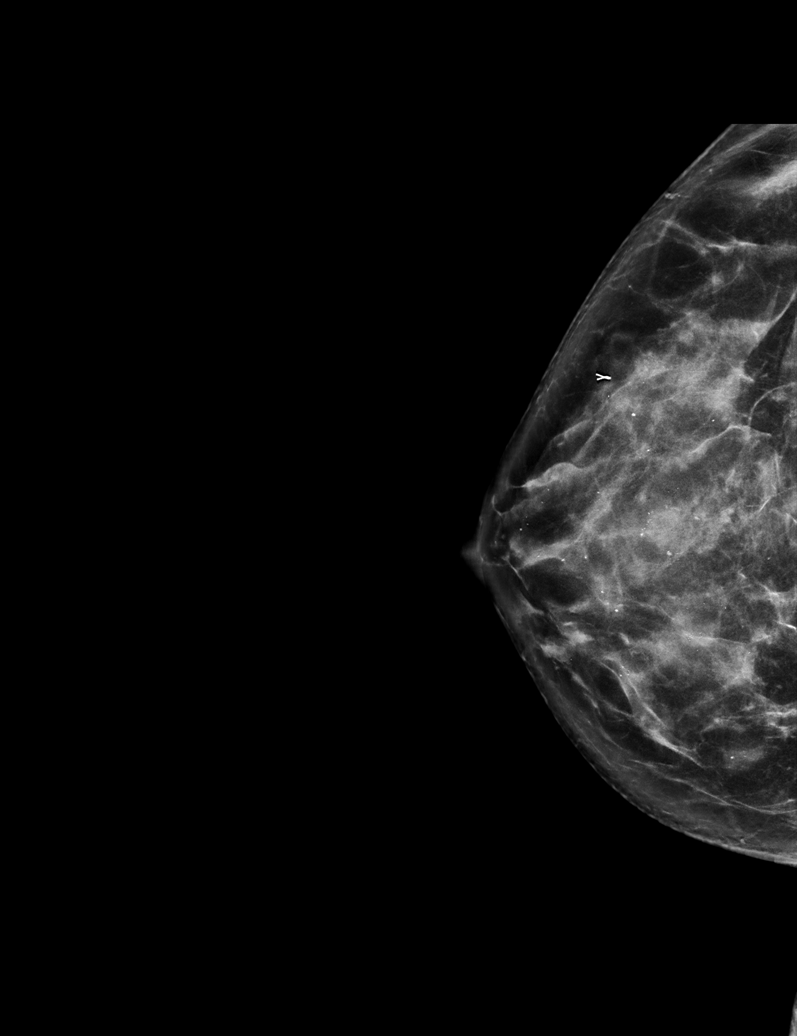

[L MLO synth-2D (3 of 3)]
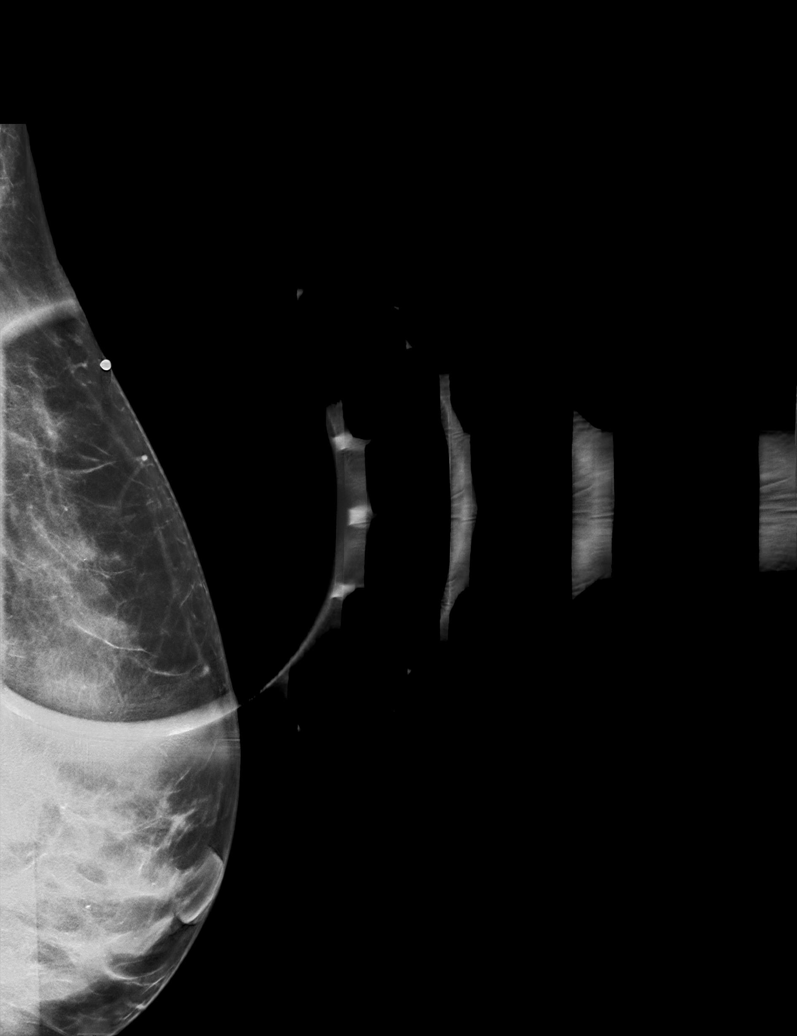

[6 of 36 positions shown; findings below may reference images not displayed]

ACR Breast Density Category c: The breast tissue is heterogeneously
dense, which may obscure small masses.
FINDINGS: Ribbon shaped clip in the lateral aspect of the right breast is
again identified. The associated mass appear stable. No suspicious
mass or malignant type microcalcifications identified in either
breast. Spot tangential view of the area of focal pain in the
upper-outer quadrant of the left breast shows normal fibroglandular
tissue.

Targeted ultrasound is performed, showing stable hypoechoic mass in
the right breast at 9 o'clock 1 cm from the nipple measuring 5 x 6 x
1.2 cm. On the prior exam it measured 5 x 6 x 1.3 cm. Sonographic
evaluation of the area of focal pain in the left breast at 1 o'clock
7 cm from the nipple shows normal fibroglandular tissue.
IMPRESSION: Stable benign-appearing mass in the 9 o'clock region of the right
breast. No evidence of malignancy in either breast.

RECOMMENDATION:
Bilateral screening mammogram in 1 year is recommended.

I have discussed the findings and recommendations with the patient.
If applicable, a reminder letter will be sent to the patient
regarding the next appointment.

BI-RADS CATEGORY  2: Benign.

## 2023-05-09 ENCOUNTER — Other Ambulatory Visit: Payer: Self-pay | Admitting: Obstetrics and Gynecology

## 2023-05-09 DIAGNOSIS — N644 Mastodynia: Secondary | ICD-10-CM

## 2023-06-20 ENCOUNTER — Ambulatory Visit: Payer: Managed Care, Other (non HMO)

## 2023-06-20 ENCOUNTER — Ambulatory Visit
Admission: RE | Admit: 2023-06-20 | Discharge: 2023-06-20 | Disposition: A | Discharge status: Home / Self Care | Payer: Managed Care, Other (non HMO) | Source: Ambulatory Visit | Attending: Obstetrics and Gynecology | Admitting: Obstetrics and Gynecology

## 2023-06-20 DIAGNOSIS — N644 Mastodynia: Secondary | ICD-10-CM

## 2023-08-15 ENCOUNTER — Encounter: Payer: Self-pay | Admitting: Family Medicine

## 2023-09-27 ENCOUNTER — Encounter: Payer: Self-pay | Admitting: Internal Medicine

## 2023-10-21 ENCOUNTER — Ambulatory Visit (AMBULATORY_SURGERY_CENTER): Payer: Managed Care, Other (non HMO) | Admitting: *Deleted

## 2023-10-21 VITALS — Ht 62.0 in

## 2023-10-21 DIAGNOSIS — Z1211 Encounter for screening for malignant neoplasm of colon: Secondary | ICD-10-CM

## 2023-10-21 MED ORDER — NA SULFATE-K SULFATE-MG SULF 17.5-3.13-1.6 GM/177ML PO SOLN: 1.0000 | Freq: Once | ORAL | 0 refills | Status: AC

## 2023-10-21 NOTE — Progress Notes (Signed)
Pre visit completerd over telephone. Instructions mailed. Patient advised to contact office if any changes to health, new medications. Or ER visits.   No egg or soy allergy known to patient  No issues known to pt with past sedation with any surgeries or procedures Patient denies ever being told they had issues or difficulty with intubation  No FH of Malignant Hyperthermia Pt is not on diet pills Pt is not on  home 02  Pt is not on blood thinners  Pt denies issues with constipation  No A fib or A flutter Have any cardiac testing pending--NO Pt instructed to use [email or web address] or GoodRx for a price reduction on prep

## 2023-10-24 ENCOUNTER — Encounter: Payer: Self-pay | Admitting: Internal Medicine

## 2023-10-28 ENCOUNTER — Telehealth: Payer: Self-pay

## 2023-10-28 NOTE — Telephone Encounter (Signed)
Pt aware that appt for colon has been changed to 12/17 at 11:30am. She knows to start second dose of prep at 6:30am, nothing by mouth after 8:30am. Pt to arrive at 10:30am for 11:30am procedure. Pt verbalized understanding.

## 2023-11-01 ENCOUNTER — Ambulatory Visit (AMBULATORY_SURGERY_CENTER): Payer: Managed Care, Other (non HMO) | Admitting: Internal Medicine

## 2023-11-01 ENCOUNTER — Encounter: Payer: Self-pay | Admitting: Internal Medicine

## 2023-11-01 VITALS — BP 112/61 | HR 68 | Temp 98.8°F | Resp 10 | Ht 62.0 in | Wt 125.0 lb

## 2023-11-01 DIAGNOSIS — Z1211 Encounter for screening for malignant neoplasm of colon: Secondary | ICD-10-CM

## 2023-11-01 DIAGNOSIS — K5730 Diverticulosis of large intestine without perforation or abscess without bleeding: Secondary | ICD-10-CM

## 2023-11-01 DIAGNOSIS — D122 Benign neoplasm of ascending colon: Secondary | ICD-10-CM | POA: Diagnosis not present

## 2023-11-01 MED ORDER — SODIUM CHLORIDE 0.9 % IV SOLN: 500.0000 mL | Freq: Once | INTRAVENOUS | Status: DC

## 2023-11-01 NOTE — Progress Notes (Signed)
Pt's states no medical or surgical changes since previsit or office visit. 

## 2023-11-01 NOTE — Progress Notes (Signed)
Sedate, gd SR, tolerated procedure well, VSS, report to RN 

## 2023-11-01 NOTE — Op Note (Signed)
[facility] Patient Name: Monica Carpenter Procedure Date: 11/01/2023 12:13 PM MRN: [id number] Endoscopist: Wilhemina Bonito. Marina Goodell , MD, [id number] Age: 52 Referring MD:  Date of Birth: 05/20/1971 Gender: Female Account #: [id number] Procedure:                Colonoscopy with cold snare polypectomy x 1 Indications:              Screening for colorectal malignant neoplasm Medicines:                Monitored Anesthesia Care Procedure:                Pre-Anesthesia Assessment:                           - Prior to the procedure, a History and Physical                            was performed, and patient medications and                            allergies were reviewed. The patient's tolerance of                            previous anesthesia was also reviewed. The risks                            and benefits of the procedure and the sedation                            options and risks were discussed with the patient.                            All questions were answered, and informed consent                            was obtained. Prior Anticoagulants: The patient has                            taken no anticoagulant or antiplatelet agents. ASA                            Grade Assessment: II - A patient with mild systemic                            disease. After reviewing the risks and benefits,                            the patient was deemed in satisfactory condition to                            undergo the procedure.                           After obtaining informed consent, the colonoscope  was passed under direct vision. Throughout the                            procedure, the patient's blood pressure, pulse, and                            oxygen saturations were monitored continuously. The                            Olympus Scope SN [id number] was introduced through the                            anus and advanced to the the cecum, identified by                             appendiceal orifice and ileocecal valve. The                            ileocecal valve, appendiceal orifice, and rectum                            were photographed. The quality of the bowel                            preparation was excellent. The colonoscopy was                            performed without difficulty. The patient tolerated                            the procedure well. The bowel preparation used was                            SUPREP via split dose instruction. Scope In: 12:18:43 PM Scope Out: 12:32:33 PM Scope Withdrawal Time: 0 hours 10 minutes 30 seconds  Total Procedure Duration: 0 hours 13 minutes 50 seconds  Findings:                 A 3 mm polyp was found in the ascending colon. The                            polyp was removed with a cold snare. Resection and                            retrieval were complete.                           Multiple diverticula were found in the entire colon.                           The exam was otherwise without abnormality on                            direct and retroflexion views. Complications:  No immediate complications. Estimated blood loss:                            None. Estimated Blood Loss:     Estimated blood loss: none. Impression:               - One 3 mm polyp in the ascending colon, removed                            with a cold snare. Resected and retrieved.                           - Diverticulosis in the entire examined colon.                           - The examination was otherwise normal on direct                            and retroflexion views. Recommendation:           - Repeat colonoscopy in 7-10 years for surveillance.                           - Patient has a contact number available for                            emergencies. The signs and symptoms of potential                            delayed complications were discussed with the                            patient.  Return to normal activities tomorrow.                            Written discharge instructions were provided to the                            patient.                           - Resume previous diet.                           - Continue present medications.                           - Await pathology results. Wilhemina Bonito. Marina Goodell, MD 11/01/2023 12:38:05 PM This report has been signed electronically.

## 2023-11-01 NOTE — Progress Notes (Signed)
HISTORY OF PRESENT ILLNESS:  Monica Carpenter is a 52 y.o. female presents for routine screening colonoscopy.  No complaints  REVIEW OF SYSTEMS:  All non-GI ROS negative except for  History reviewed. No pertinent past medical history.  Past Surgical History:  Procedure Laterality Date   CESAREAN SECTION     x 4   PARTIAL HYSTERECTOMY      Social History Monica Carpenter  reports that she has never smoked. She does not have any smokeless tobacco history on file. She reports that she does not drink alcohol and does not use drugs.  family history includes Colon polyps in her mother.  Allergies  Allergen Reactions   Penicillins Other (See Comments)    Yeast infection       PHYSICAL EXAMINATION: Vital signs: BP 124/77   Pulse 68   Temp 98.8 F (37.1 C) (Temporal)   Ht 5\' 2"  (1.575 m)   Wt 125 lb (56.7 kg)   SpO2 99%   BMI 22.86 kg/m  General: Well-developed, well-nourished, no acute distress HEENT: Sclerae are anicteric, conjunctiva pink. Oral mucosa intact Lungs: Clear Heart: Regular Abdomen: soft, nontender, nondistended, no obvious ascites, no peritoneal signs, normal bowel sounds. No organomegaly. Extremities: No edema Psychiatric: alert and oriented x3. Cooperative     ASSESSMENT:  Colon cancer screening   PLAN:   Screening colonoscopy

## 2023-11-01 NOTE — Progress Notes (Signed)
Called to room to assist during endoscopic procedure.  Patient ID and intended procedure confirmed with present staff. Received instructions for my participation in the procedure from the performing physician.  

## 2023-11-01 NOTE — Patient Instructions (Signed)

## 2023-11-02 ENCOUNTER — Telehealth: Payer: Self-pay

## 2023-11-02 NOTE — Telephone Encounter (Signed)
  Follow up Call-     11/01/2023   11:34 AM  Call back number  Post procedure Call Back phone  # [phone]  Permission to leave phone message Yes     Patient questions:  Do you have a fever, pain , or abdominal swelling? No. Pain Score  0 *  Have you tolerated food without any problems? Yes.    Have you been able to return to your normal activities? Yes.    Do you have any questions about your discharge instructions: Diet   No. Medications  No. Follow up visit  No.  Do you have questions or concerns about your Care? No.  Actions: * If pain score is 4 or above: No action needed, pain <4.

## 2023-11-03 LAB — SURGICAL PATHOLOGY

## 2023-11-10 ENCOUNTER — Encounter: Payer: Self-pay | Admitting: Internal Medicine
